# Patient Record
Sex: Female | Born: 1955 | Race: White | Hispanic: No | Marital: Married | Smoking: Never smoker
Health system: Southern US, Community
[De-identification: ages and names within clinical notes are randomized; demographics above are authoritative.]

## PROBLEM LIST (undated history)

## (undated) HISTORY — PX: COSMETIC SURGERY: SHX468

---

## 2015-03-25 ENCOUNTER — Emergency Department: Payer: Commercial Managed Care - POS

## 2015-03-25 ENCOUNTER — Emergency Department
Admission: EM | Admit: 2015-03-25 | Discharge: 2015-03-25 | Disposition: A | Discharge status: Home / Self Care | Payer: Commercial Managed Care - POS | Attending: Emergency Medicine | Admitting: Emergency Medicine

## 2015-03-25 DIAGNOSIS — M7989 Other specified soft tissue disorders: Secondary | ICD-10-CM | POA: Insufficient documentation

## 2015-03-25 NOTE — ED Provider Notes (Signed)
EMERGENCY DEPARTMENT HISTORY AND PHYSICAL EXAM    Date: 03/25/2015  Patient Name: Renee Benton  Attending Physician:  Kelly Splinter, MD  Diagnosis and Treatment Plan       Clinical Impression:   1. Leg swelling        Treatment Plan:   ED Disposition     Discharge Renee Benton discharge to home/self care.    Condition at disposition: Stable            History of Presenting Illness     Chief Complaint   Patient presents with   . Leg Pain       History Provided By: the patient  Chief Complaint: leg pain    Additional History: Renee Benton is a 60 y.o. female presenting to the ED 6 days s/p a fall onto her buttocks c/o left leg weakness; onset today PTA.  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  She reports a Hx of varicose veins.  The pt has been taking 800mg  Ibuprofen for pain, but has not recently.  Pt denies any CP, palpitations, fevers, diarrhea, constipation, or any other complaints at this time.        PCP: Georgina Pillion, MD    No current facility-administered medications for this encounter.    Current outpatient prescriptions:   .  atorvastatin (LIPITOR) 10 MG tablet,   , Disp: , Rfl:   .  eszopiclone (LUNESTA) 1 MG tablet,   , Disp: , Rfl:   .  valACYclovir HCL (VALTREX) 500 MG tablet,   , Disp: , Rfl:     Past Medical History     History reviewed. No pertinent past medical history.  Past Surgical History   Procedure Laterality Date   . Cosmetic surgery         Family History     History reviewed. No pertinent family history.    Social History     Social History     Social History   . Marital Status: Married     Spouse Name: N/A   . Number of Children: N/A   . Years of Education: N/A     Social History Main Topics   . Smoking status: Never Smoker    . Smokeless tobacco: Not on file   . Alcohol Use: Yes   . Drug Use: No   . Sexual Activity: Not on file     Other Topics Concern   . Not on file     Social History Narrative   . No narrative on file       Allergies      Allergies not on file    Review of Systems     Review of Systems   Constitutional: Negative for fever.   Cardiovascular: Negative for chest pain and palpitations.   Gastrointestinal: Negative for diarrhea and constipation.   Musculoskeletal:        Positive for left leg numbness   Patient asked and denies other acute complaints or concerns (see HPI).    Physical Exam     BP 125/81 mmHg  Pulse 70  Temp(Src) 98.1 F (36.7 C) (Oral)  Resp 18  Ht 5\' 6"  (1.676 m)  Wt 80.74 kg  BMI 28.74 kg/m2  SpO2 97%  Pulse Oximetry Analysis - Normal 97% On RA    Physical Exam   Constitutional: She is oriented to person, place, and time. She appears well-developed and well-nourished.   Head: Normocephalic and  atraumatic.   Eyes: No scleral icterus.   Neck: Normal range of motion. Neck supple.   Cardiovascular: Intact distal pulses.    Pulmonary/Chest: Effort normal. No respiratory distress.   Neurological: She is alert and oriented to person, place, and time.   Skin: Skin is warm and dry.   Psychiatric: She has a normal mood and affect. Her behavior is normal. Judgment and thought content normal.   LLE: varicosities noted, no pitting edema, generalized ttp left calf without rash    Diagnostic Study Results     Labs -     Results     ** No results found for the last 24 hours. **          Radiologic Studies -   Radiology Results (24 Hour)     Procedure Component Value Units Date/Time    US Venous Dopp Left Low Extrem [[id number]] Resulted:  03/25/15 1030    Order Status:  Sent Updated:  03/25/15 1032      .    Doctor's Notes     Throughout the stay in the Emergency Department, questions and concerns surrounding pain control, care plans, diagnostic studies, effects of medications administered or prescribed, and future prognostic dilemmas were assessed and addressed.    ROS addendum: The patient and/or family was asked if they had any other complaints or concerns that we could address today and nothing of significance was noted.      IMP & PLAN:     9:56 AM -  Discussed ED plan with the pt, the pt agrees with the plan.  The pt declined pain medication at this time    11:04 AM - Rx use and side effects, results, home self care, discharge instructions, and return precautions discussed extensively with patient Possibility of evolving illness reviewed. All questions solicited and addressed. Patient is amenable to discharge. No signs pe.  Need for repeat ultrasound if sxs persist in 6 weeks discussed. No signs infection/trauma and self-monitoring for this discussed. She will use compression stockings while flying.  _______________________________  Medical DeMedical Decision Makingcision Making  Attestations:     Physician/Midlevel provider first contact with patient: 03/25/15 0943         This note is prepared for Kelly Splinter, MD. The scribe's documentation has been prepared under my direction and personally reviewed by me in its entirety.  I confirm that the note above accurately reflects all work, treatment, procedures, and medical decision making performed by me.     I am the first provider for this patient.      Kelly Splinter, MD is the primary emergency doctor of record.      I reviewed the vital signs, available nursing notes, past medical history, past surgical history, family history and social history.    _______________________________      Westley Foots, MD  03/28/15 [phone]

## 2015-03-25 NOTE — ED Notes (Signed)
Left posterior calf and leg pain x 1 day

## 2015-03-25 NOTE — ED Notes (Signed)
Discharged by md left before repeat vs

## 2015-03-25 NOTE — Discharge Instructions (Signed)
Come back to the emergency room for chest pain, trouble breathing, rash, or palpitations.  If you still have leg pain in 6 weeks come back.      DVT LE Ruled Out    You were seen for your leg symptoms.    This can include redness, swelling and/or pain. In these cases, the doctor is often worried that you had a blood clot in your leg. This is also called a deep venous thrombosis (DVT).    The diagnosis is often from an ultrasound of the leg. This is also called a duplex Doppler ultrasound. This is a non-invasive (on the outside of your body) test. It can detect a blood clot in the veins. An ultrasound specialist does the test. A radiologist or vascular (blood vessel) specialist looks at it. The test uses sound waves to look at the veins. It can help decide if they have blood flowing in them or if they are clotted. The test is very good at finding blood clots in the upper leg (thigh). It can also find clots in the lower leg (calf). However, the test is not as good in this part of the leg. The smaller the clot, the harder it can be to find. In these cases, the ultrasound can be re-done in a few days. The doctor will decide if this is needed.    The ultrasound of your leg was normal. It did not show any blood clots.    The exact cause of your problem is still not known. We don't know the exact cause of your symptoms. However, your doctor thinks it s safe for you to go home. You may need to see your doctor or referral doctor for more tests.    Medical problems can be very complex and some conditions can be very difficult to figure out, even with advanced medical testing. It is important to understand that even after an Emergency Department (or Urgent Care) visit or an observation stay, you may still have a serious medical problem.    YOU SHOULD SEEK MEDICAL ATTENTION IMMEDIATELY, EITHER HERE OR AT THE NEAREST EMERGENCY DEPARTMENT, IF ANY OF THE FOLLOWING OCCURS:   Your leg swelling gets worse.   Your legs get  red and you have pain / fever (temperature higher than 100.9F / 38C).   You have any chest pain or tightness or pressure over your heart.   You are short of breath, especially if there is pain in your chest when you breathe in.   Your leg feels numb (no feeling) or cool.   Fainting.   Your symptoms or problems get worse.

## 2015-04-02 ENCOUNTER — Telehealth: Payer: Self-pay | Admitting: Emergency Medicine

## 2015-04-02 NOTE — Telephone Encounter (Signed)
Doing better. Called at 8:55am and woke her up as she was in another time zone.  I apologized.

## 2023-03-12 IMAGING — MR BIOSPECTIVE BRAIN STUDY PROTOCOL
7 series · 48 of 48 positions shown · IV contrast (gadolinium)
Comparison: Brain MRI March 05, 2022.

________________________________________________________________________________________________ 
BIOSPECTIVE BRAIN STUDY PROTOCOL, 03/12/2023 [DATE]: 
CLINICAL INDICATION: Mild Cognitive Impairment Of Uncertain Or Unknown Etiology 
, twelve-month follow-up study.
TECHNIQUE: Two 3D sagittal T1, one 3D sagittal flair, one 3D sagittal T2 and one 
axial GRE images of the brain were performed without intravenous gadolinium 
enhancement. Patient was scanned on a 3T magnet.

[Series 101: survey · axial · 10.0mm · 0.98mm/px · z∈[+0,+125]mm · 2 of 5 slices shown]
[im 1/5]
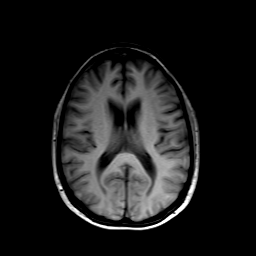
[im 5/5]
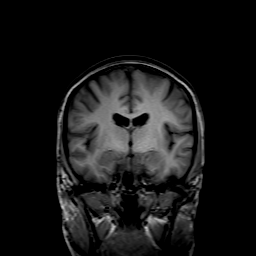

[Series 201: 3d_t1_mprage · sagittal · 1.0mm · 1.00mm/px · 11 of 211 slices shown]
[im 1/211]
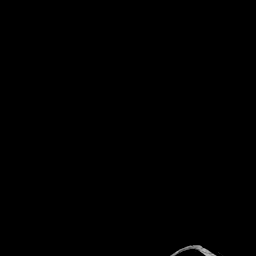
[im 22/211]
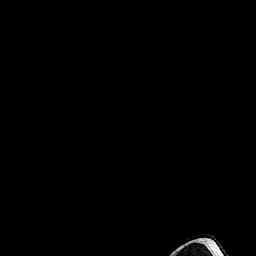
[im 43/211]
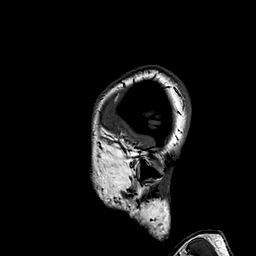
[im 64/211]
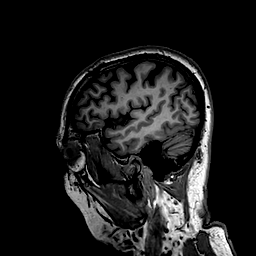
[im 85/211]
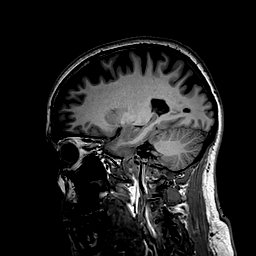
[im 106/211]
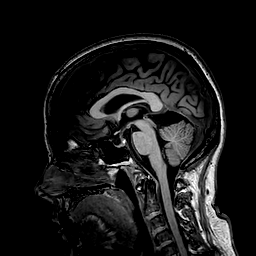
[im 127/211]
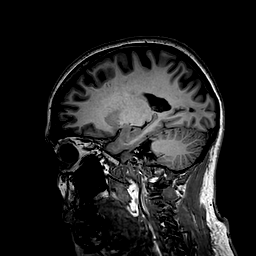
[im 148/211]
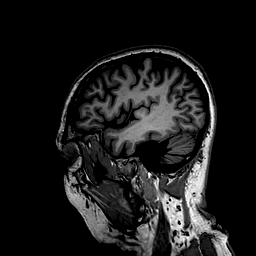
[im 169/211]
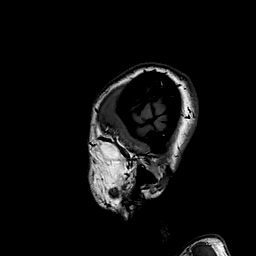
[im 190/211]
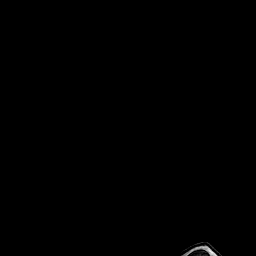
[im 211/211]
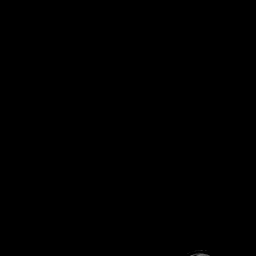

[Series 301: 3d_t1_mprage_repeat · sagittal · 1.0mm · 1.00mm/px · 11 of 211 slices shown]
[im 1/211]
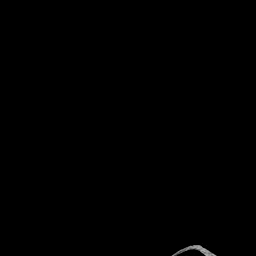
[im 22/211]
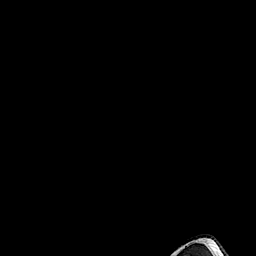
[im 43/211]
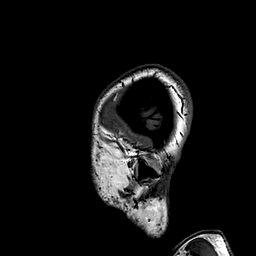
[im 64/211]
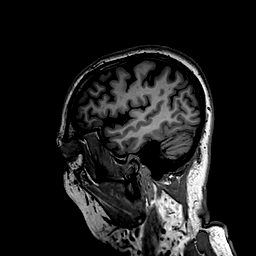
[im 85/211]
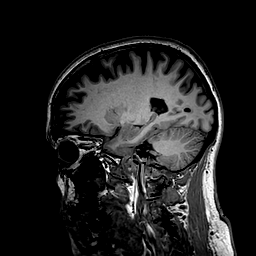
[im 106/211]
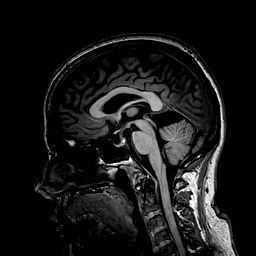
[im 127/211]
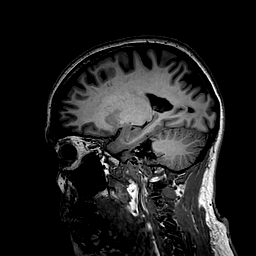
[im 148/211]
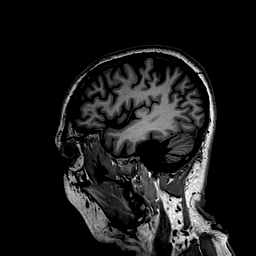
[im 169/211]
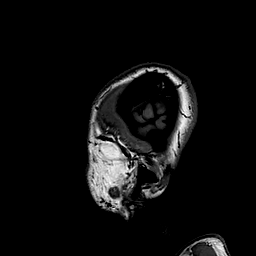
[im 190/211]
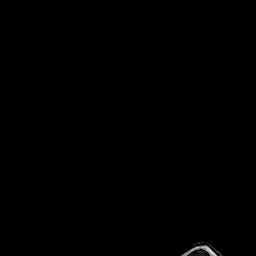
[im 211/211]
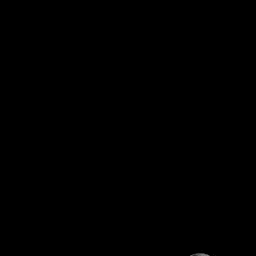

[Series 401: 3d_flair · sagittal · 1.2mm · 0.89mm/px · 9 of 160 slices shown]
[im 1/160]
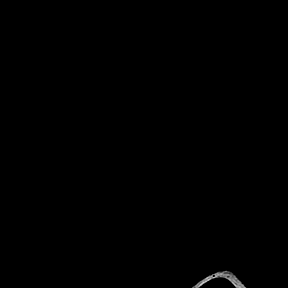
[im 20/160]
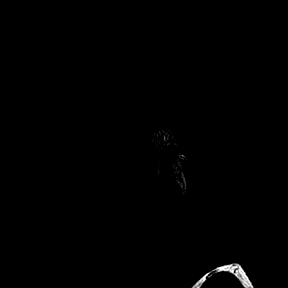
[im 40/160]
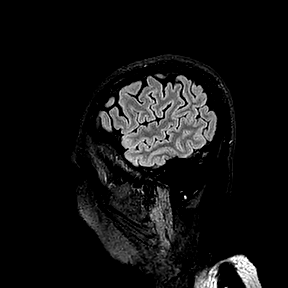
[im 60/160]
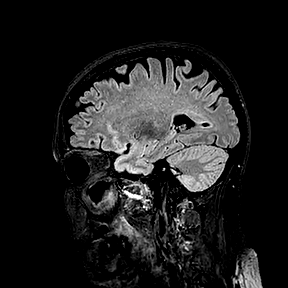
[im 80/160]
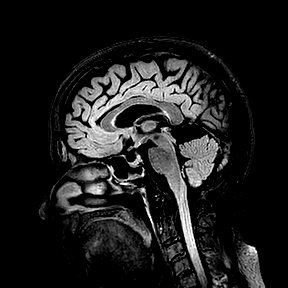
[im 100/160]
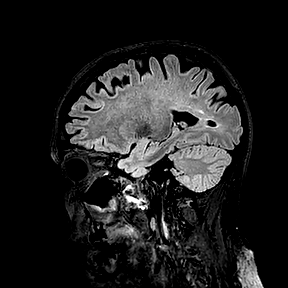
[im 120/160]
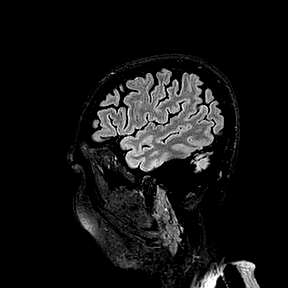
[im 140/160]
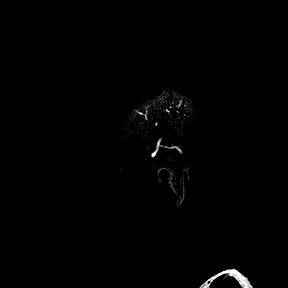
[im 160/160]
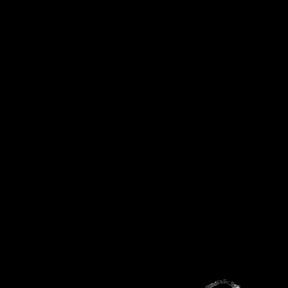

[Series 501: 3d_t2_tse · sagittal · 1.0mm · 1.00mm/px · 11 of 211 slices shown]
[im 1/211]
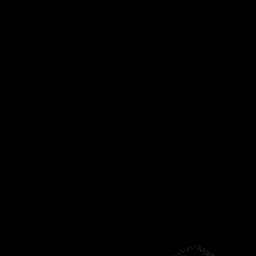
[im 22/211]
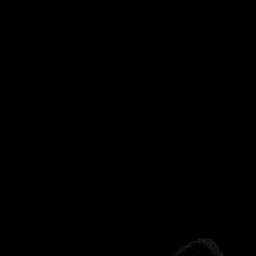
[im 43/211]
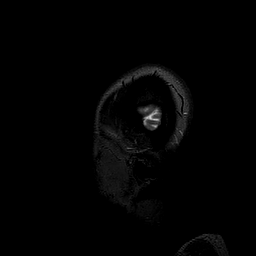
[im 64/211]
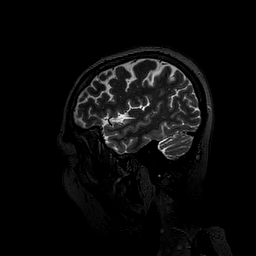
[im 85/211]
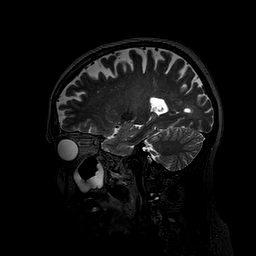
[im 106/211]
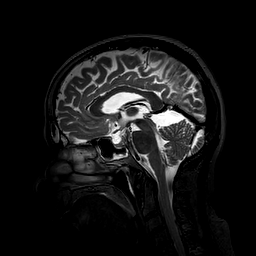
[im 127/211]
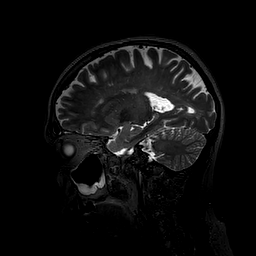
[im 148/211]
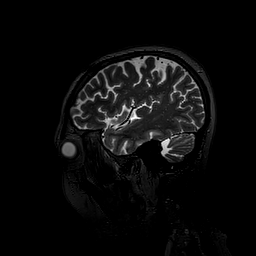
[im 169/211]
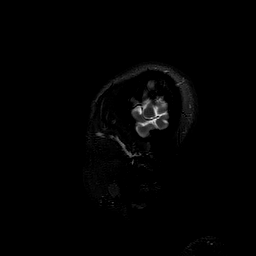
[im 190/211]
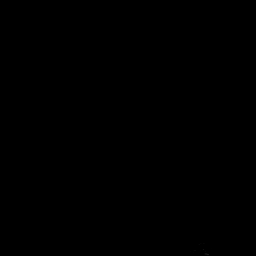
[im 211/211]
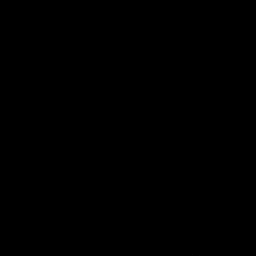

[Series 601: 2d_gre · axial · 4.0mm · 1.00mm/px · z∈[-153,+33]mm · 2 of 45 slices shown (1 of 2)]
[im 1/45]
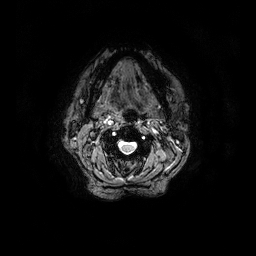
[im 45/45]
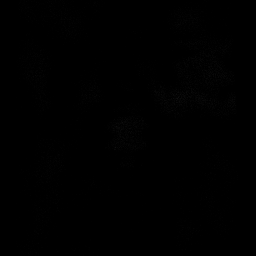

[Series 701: 2d_gre · axial · 4.0mm · 1.00mm/px · z∈[-153,+33]mm · 2 of 45 slices shown (2 of 2)]
[im 1/45]
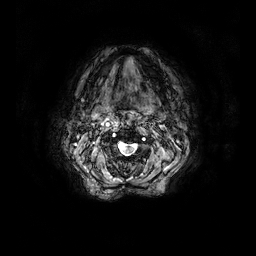
[im 45/45]
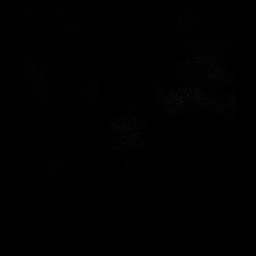

[48 of 48 positions shown; findings below may reference images not displayed]

FINDINGS: Intracranial: Stable mild periventricular T-2 FLAIR hyperintensity, likely 
chronic microangiopathy. No mass or abnormal extra-axial fluid collection. No 
large sellar mass lesion. Cerebellar tonsils are minimally low-lying although 
maintain her normal rounded configuration. No mass or abnormal extra-axial fluid 
collection, other than a stable appearing arachnoid cyst along the superior 
cerebellar hemispheres bilaterally. 
Orbits, sinuses, temporal bones: Bilateral pseudophakia. Progressive paranasal 
sinus membrane thickening, most prominent within the ethmoid air cells and the 
left sphenoid locule as well as the right maxillary sinus. No air-fluid level. 
No mastoid or middle ear effusion. 
Marrow signal, soft tissues: Evidence of active facet arthritis involving the 
right C3-C4 facet.
IMPRESSION: Stable white matter chronic microangiopathic change. 
Progressive paranasal sinus membrane thickening. 
Active facet arthritis right C3-C4 facet. Consider dedicated CT cervical spine 
for better characterization.
